# Patient Record
Sex: Male | Born: 1995 | Race: White | Hispanic: Yes | Marital: Single | Smoking: Former smoker
Health system: Southern US, Community
[De-identification: ages and names within clinical notes are randomized; demographics above are authoritative.]

---
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
## 2014-08-29 IMAGING — CR DG HAND 2V*R*
1 series · 1 of 1 positions shown · non-contrast
Comparison: None.

CLINICAL DATA: trauma, injury, laceration

RIGHT HAND - 2 VIEW

[PA]
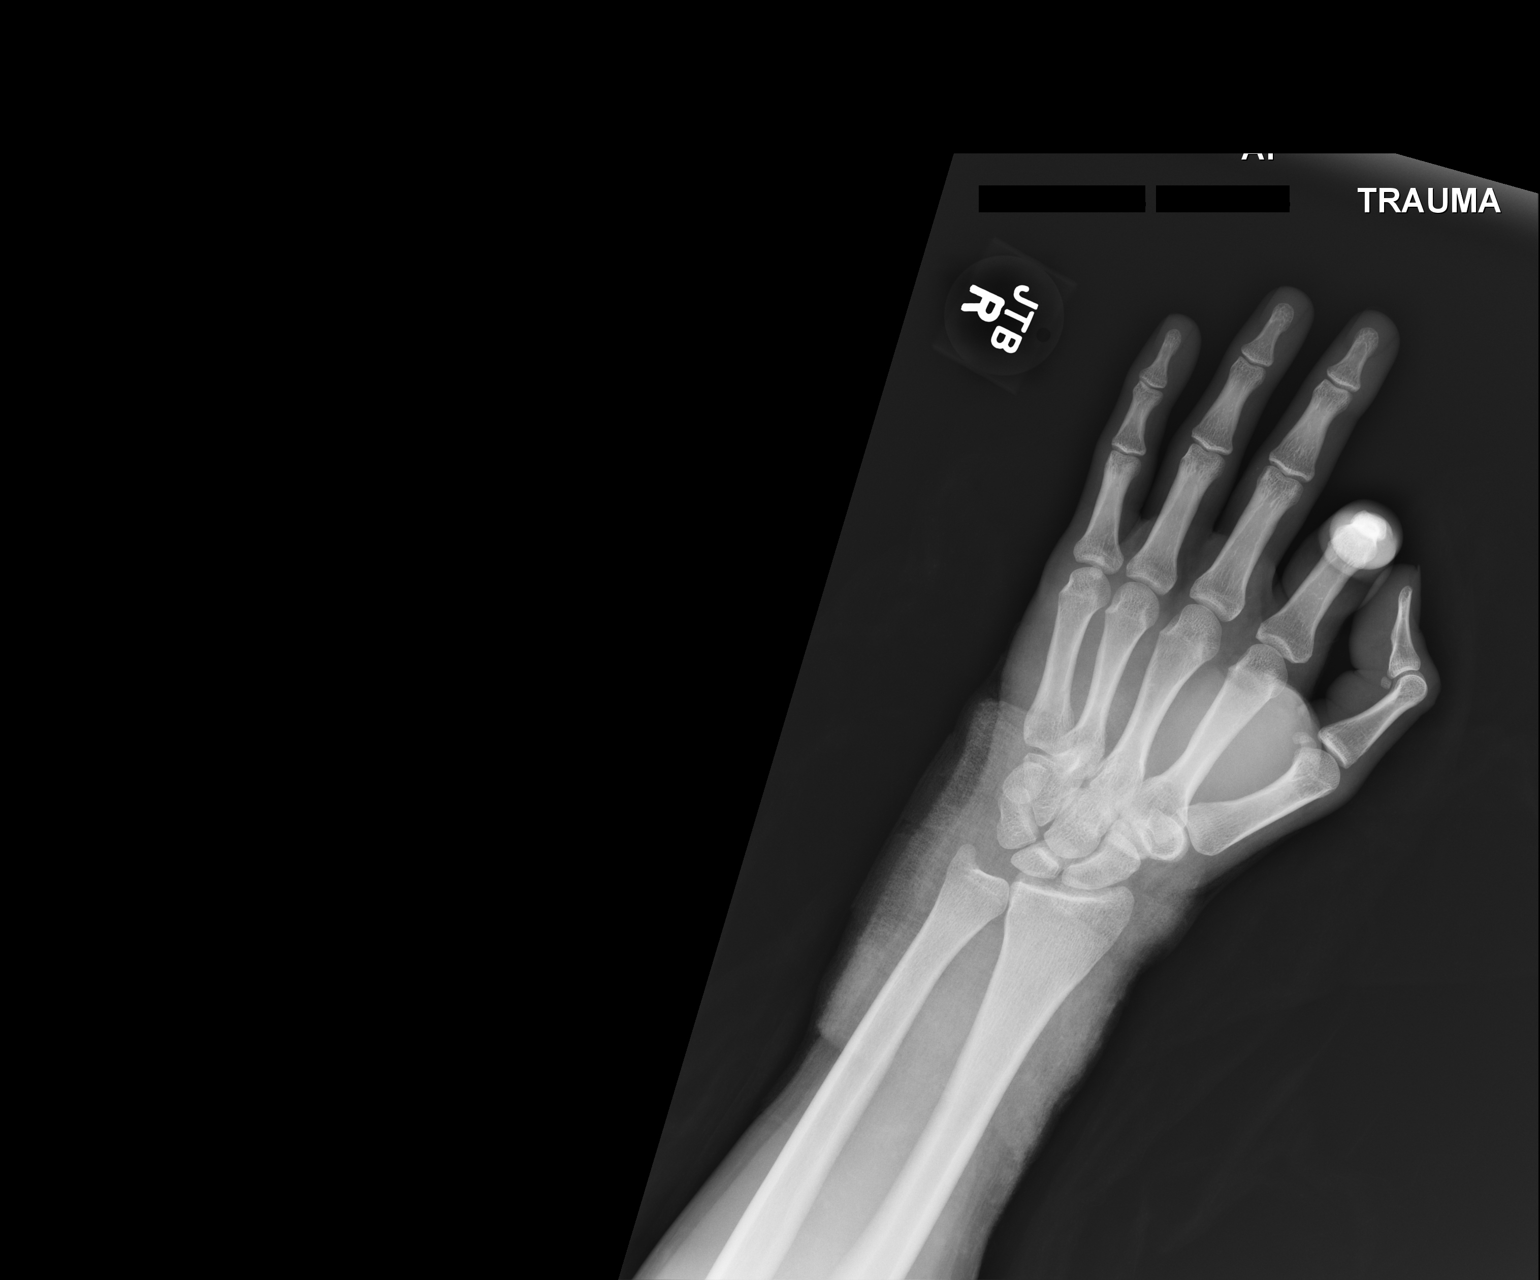

[1 of 1 positions shown; findings below may reference images not displayed]

FINDINGS: Single AP view performed.  Dressing noted over the wrist.
Thumb and index finger are flexed in position.  No large radiopaque
foreign body or definite acute osseous finding.
IMPRESSION: Soft tissue injury.

No gross osseous finding or radiopaque foreign body.

## 2014-12-17 ENCOUNTER — Encounter: Payer: Self-pay | Admitting: Family

## 2014-12-17 ENCOUNTER — Encounter: Payer: Self-pay | Admitting: Vascular Surgery

## 2014-12-18 ENCOUNTER — Ambulatory Visit: Payer: Self-pay | Admitting: Family

## 2014-12-18 ENCOUNTER — Other Ambulatory Visit (HOSPITAL_COMMUNITY): Payer: Self-pay

## 2021-03-23 ENCOUNTER — Ambulatory Visit
Admission: EM | Admit: 2021-03-23 | Discharge: 2021-03-23 | Disposition: A | Discharge status: Home / Self Care | Payer: Self-pay | Attending: Emergency Medicine | Admitting: Emergency Medicine

## 2021-03-23 ENCOUNTER — Encounter: Payer: Self-pay | Admitting: Emergency Medicine

## 2021-03-23 ENCOUNTER — Other Ambulatory Visit: Payer: Self-pay

## 2021-03-23 DIAGNOSIS — S0502XA Injury of conjunctiva and corneal abrasion without foreign body, left eye, initial encounter: Secondary | ICD-10-CM

## 2021-03-23 MED ORDER — ERYTHROMYCIN 5 MG/GM OP OINT: TOPICAL_OINTMENT | OPHTHALMIC | 0 refills | Status: AC

## 2021-03-23 NOTE — Discharge Instructions (Signed)
Use erythromycin ointment 4 times daily Follow-up with Dr. Charlotte Sanes this week-contact below Please return if any symptoms changing or worsening
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
